# Patient Record
Sex: Female | Born: 1995 | Race: Black or African American | Hispanic: No | Marital: Single | Smoking: Never smoker
Health system: Southern US, Community
[De-identification: ages and names within clinical notes are randomized; demographics above are authoritative.]

---
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
## 2015-04-10 ENCOUNTER — Ambulatory Visit: Payer: Federal, State, Local not specified - PPO | Admitting: Obstetrics

## 2015-05-28 ENCOUNTER — Ambulatory Visit (INDEPENDENT_AMBULATORY_CARE_PROVIDER_SITE_OTHER): Payer: Federal, State, Local not specified - PPO

## 2015-05-28 ENCOUNTER — Ambulatory Visit (INDEPENDENT_AMBULATORY_CARE_PROVIDER_SITE_OTHER): Payer: Federal, State, Local not specified - PPO | Admitting: Urgent Care

## 2015-05-28 VITALS — BP 120/76 | HR 90 | Temp 98.7°F | Resp 16 | Ht 66.0 in | Wt 135.2 lb

## 2015-05-28 DIAGNOSIS — Z0000 Encounter for general adult medical examination without abnormal findings: Secondary | ICD-10-CM

## 2015-05-28 DIAGNOSIS — Z111 Encounter for screening for respiratory tuberculosis: Secondary | ICD-10-CM

## 2015-05-28 LAB — CBC
HEMATOCRIT: 37.6 % (ref 36.0–46.0)
HEMOGLOBIN: 12 g/dL (ref 12.0–15.0)
MCH: 26.1 pg (ref 26.0–34.0)
MCHC: 31.9 g/dL (ref 30.0–36.0)
MCV: 81.7 fL (ref 78.0–100.0)
MPV: 10.7 fL (ref 8.6–12.4)
PLATELETS: 353 10*3/uL (ref 150–400)
RBC: 4.6 MIL/uL (ref 3.87–5.11)
RDW: 14.4 % (ref 11.5–15.5)
WBC: 3.3 10*3/uL — ABNORMAL LOW (ref 4.0–10.5)

## 2015-05-28 LAB — COMPREHENSIVE METABOLIC PANEL
ALT: 8 U/L (ref 5–32)
AST: 17 U/L (ref 12–32)
Albumin: 4.5 g/dL (ref 3.6–5.1)
Alkaline Phosphatase: 52 U/L (ref 47–176)
BUN: 8 mg/dL (ref 7–20)
CO2: 24 meq/L (ref 20–31)
CREATININE: 0.68 mg/dL (ref 0.50–1.00)
Calcium: 9.8 mg/dL (ref 8.9–10.4)
Chloride: 105 mEq/L (ref 98–110)
Glucose, Bld: 74 mg/dL (ref 65–99)
Potassium: 4.2 mEq/L (ref 3.8–5.1)
Sodium: 137 mEq/L (ref 135–146)
Total Bilirubin: 0.4 mg/dL (ref 0.2–1.1)
Total Protein: 7.8 g/dL (ref 6.3–8.2)

## 2015-05-28 LAB — LIPID PANEL
CHOL/HDL RATIO: 2.9 ratio (ref <=5.0)
Cholesterol: 146 mg/dL (ref 125–170)
HDL: 51 mg/dL (ref 36–76)
LDL Cholesterol: 85 mg/dL (ref <110)
TRIGLYCERIDES: 50 mg/dL (ref 40–136)
VLDL: 10 mg/dL (ref <30)

## 2015-05-28 NOTE — Progress Notes (Signed)
Tuberculosis Risk Questionnaire  1. Were you born outside the [place] in one of the following parts of the world:    [place], [place], [place], [place] or [place]?  No  2. Have you traveled outside the [place] and lived for more than one month in one of the following parts of the world:  [place], [place], [place], [place] or [place]?  No  3. Do you have a compromised immune system such as from any of the following conditions:  HIV/AIDS, organ or bone marrow transplantation, diabetes, immunosuppressive   medicines (e.g. Prednisone, Remicaide), leukemia, lymphoma, cancer of the   head or neck, gastrectomy or jejunal bypass, end-stage renal disease (on   dialysis), or silicosis?  No    4. Have you ever done one of the following:    Used crack cocaine, injected illegal drugs, worked or resided in jail or prison,   worked or resided at a homeless shelter, or worked as a Research scientist (physical sciences) in   direct contact with patients?  No  5. Have you ever been exposed to anyone with infectious tuberculosis?  No   Robin Bamberg, PA-C [facility] [phone] 05/28/2015  1:03 PM

## 2015-05-28 NOTE — Progress Notes (Signed)
MRN: [id number] DOB: 1996-10-26  Subjective:   Robin Lucas is a 19 y.o. female presenting for chief complaint of Annual Exam  Patient is planning on working with [employer]. Is presenting for annual physical, tb screening. Denies smoking or alcohol use. Denies any other aggravating or relieving factors, no other questions or concerns.  Robin Lucas has a current medication list which includes the following prescription(s): levonorgestrel-ethinyl estradiol. She has No Known Allergies.  Gertrue denies past medical history. Denies past surgical history.   Review of Systems  Constitutional: Positive for malaise/fatigue (Fatigue). Negative for fever, chills, weight loss and diaphoresis.  HENT: Negative for congestion, ear discharge, ear pain, hearing loss, nosebleeds, sore throat and tinnitus.   Eyes: Negative for blurred vision, double vision, photophobia, pain, discharge and redness.  Respiratory: Negative for cough, shortness of breath and wheezing.   Cardiovascular: Negative for chest pain, palpitations and leg swelling.  Gastrointestinal: Negative for nausea, vomiting, abdominal pain, diarrhea, constipation and blood in stool.  Genitourinary: Negative for dysuria, urgency, frequency, hematuria and flank pain.       Patient check menstrual problems.  Musculoskeletal: Positive for back pain. Negative for myalgias and joint pain.  Skin: Negative for itching and rash.  Neurological: Positive for headaches. Negative for dizziness, tingling, seizures, loss of consciousness and weakness.  Endo/Heme/Allergies: Negative for polydipsia.       Patient checked increased eating.  Psychiatric/Behavioral: Negative for depression, suicidal ideas, hallucinations, memory loss and substance abuse. The patient is not nervous/anxious and does not have insomnia.     Objective:   Vitals: BP 120/76 mmHg  Pulse 90  Temp(Src) 98.7 F (37.1 C) (Oral)  Resp 16  Ht 5\' 6"  (1.676 m)  Wt 135 lb 3.2 oz (61.326  kg)  BMI 21.83 kg/m2  SpO2 98%  LMP 05/28/2015  Vision 20/15 (both eyes)  Physical Exam  Constitutional: She is oriented to person, place, and time. She appears well-developed and well-nourished.  HENT:  TM's intact bilaterally, no effusions or erythema. Nares patent, nasal turbinates pink and moist. No sinus tenderness. Oropharynx clear, mucous membranes moist, dentition in good repair.  Eyes: Conjunctivae and EOM are normal. Pupils are equal, round, and reactive to light. Right eye exhibits no discharge. Left eye exhibits no discharge. No scleral icterus.  Neck: Normal range of motion. Neck supple. No thyromegaly present.  Cardiovascular: Normal rate, regular rhythm and intact distal pulses.  Exam reveals no gallop and no friction rub.   No murmur heard. Pulmonary/Chest: No respiratory distress. She has no wheezes. She has no rales.  Abdominal: Soft. Bowel sounds are normal. She exhibits no distension and no mass. There is no tenderness.  Genitourinary:  Patient declined.  Musculoskeletal: Normal range of motion. She exhibits no edema or tenderness.  Lymphadenopathy:    She has no cervical adenopathy.  Neurological: She is alert and oriented to person, place, and time. She has normal reflexes.  Skin: Skin is warm and dry. No rash noted. No erythema. No pallor.  Psychiatric: She has a normal mood and affect.   [facility] reading (PRIMARY) by  Dr. Dareen Piano and PA-Yukari Flax. Chest: normal  Assessment and Plan :   1. Annual physical exam - Medically healthy, completed [employer] form - Call results to [phone] - CBC - Comprehensive metabolic panel - Lipid panel - TSH  2. Tuberculosis screening - Discussed risk factors for tb, patient wanted to make sure she didn't have to come back for this. Requested CXR to avoid  needle stick from PPD  3. Patient declined to address symptoms under ROS, decided that she would return for this.   Wallis Bamberg, PA-C [facility] [phone] 05/28/2015 12:16 PM

## 2015-05-28 NOTE — Patient Instructions (Signed)

## 2015-05-29 LAB — TSH: TSH: 0.678 u[IU]/mL (ref 0.350–4.500)

## 2015-05-31 ENCOUNTER — Encounter: Payer: Self-pay | Admitting: Family Medicine

## 2015-05-31 ENCOUNTER — Telehealth: Payer: Self-pay | Admitting: Family Medicine

## 2015-05-31 NOTE — Telephone Encounter (Signed)
lmom to call back i sent a copy of labs in the mail

## 2015-06-20 NOTE — Addendum Note (Signed)
Addended by: Carmelina Dane on: 06/20/2015 08:23 PM   Modules accepted: Kipp Brood

## 2015-06-20 NOTE — Progress Notes (Signed)
  Medical screening examination/treatment/procedure(s) were performed by non-physician practitioner and as supervising physician I was immediately available for consultation/collaboration.     

## 2015-12-19 ENCOUNTER — Other Ambulatory Visit: Payer: Self-pay | Admitting: Obstetrics

## 2015-12-26 ENCOUNTER — Telehealth: Payer: Self-pay | Admitting: *Deleted

## 2015-12-26 DIAGNOSIS — Z3041 Encounter for surveillance of contraceptive pills: Secondary | ICD-10-CM

## 2015-12-26 MED ORDER — LEVONORGESTREL-ETHINYL ESTRAD 0.15-30 MG-MCG PO TABS: 1.0000 | ORAL_TABLET | Freq: Every day | ORAL | Status: AC

## 2015-12-26 NOTE — Telephone Encounter (Signed)
Patient's mother is calling for refill on OCP 2/23 11:12 Rx refilled - patient needs annual check in- not pap. Please call to schedule.

## 2017-02-14 IMAGING — CR DG CHEST 1V
1 series · 1 of 1 positions shown · non-contrast
Comparison: None.

CLINICAL DATA: Tuberculosis screening

EXAM:
CHEST  1 VIEW

[PA]
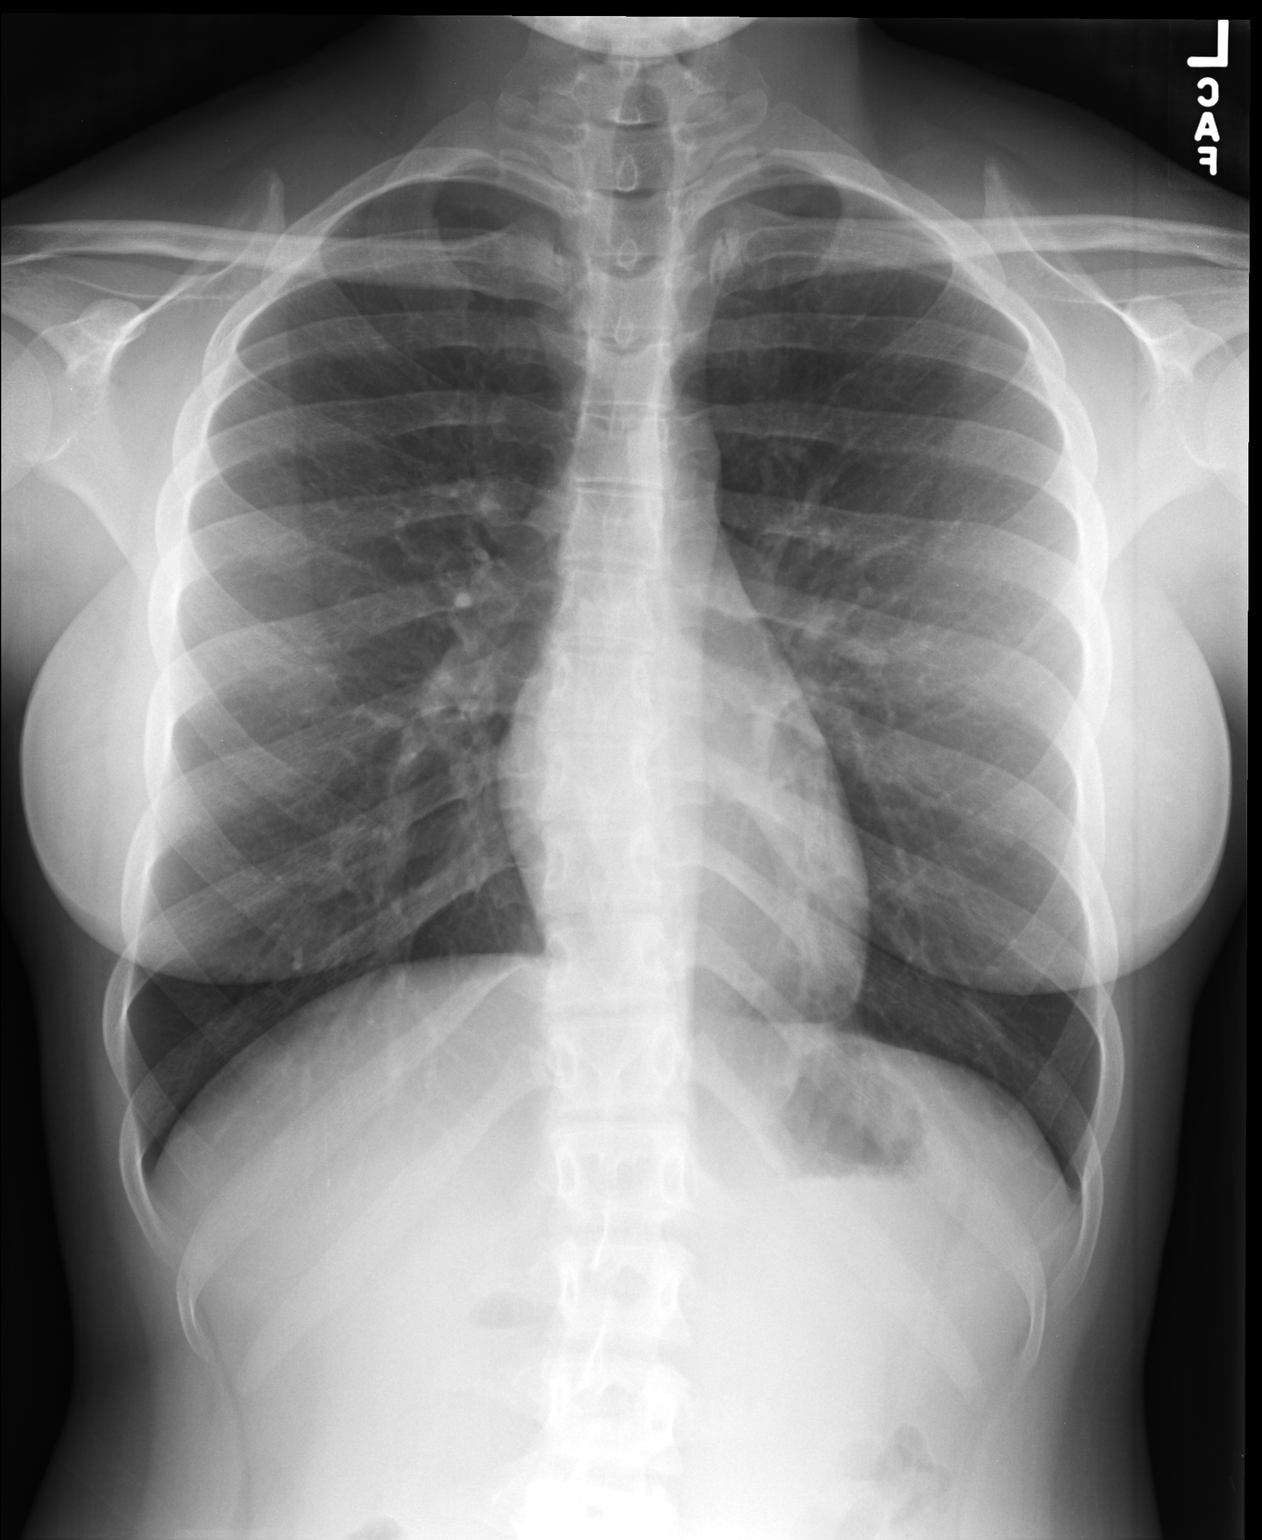

[1 of 1 positions shown; findings below may reference images not displayed]

FINDINGS: The heart size and mediastinal contours are within normal limits.
Both lungs are clear. The visualized skeletal structures are
unremarkable.
IMPRESSION: No active cardiopulmonary disease.
